# Patient Record
Sex: Female | Born: 2005 | Race: White | Hispanic: No | Marital: Single | Smoking: Never smoker
Health system: Southern US, Community
[De-identification: ages and names within clinical notes are randomized; demographics above are authoritative.]

## PROBLEM LIST (undated history)

## (undated) DIAGNOSIS — J302 Other seasonal allergic rhinitis: Secondary | ICD-10-CM

## (undated) DIAGNOSIS — L309 Dermatitis, unspecified: Secondary | ICD-10-CM

---
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
## 2025-03-03 ENCOUNTER — Ambulatory Visit: Admitting: Physician Assistant
